# Patient Record
Sex: Female | Born: 1980 | Race: White | Hispanic: No | Marital: Married | Smoking: Former smoker
Health system: Southern US, Community
[De-identification: ages and names within clinical notes are randomized; demographics above are authoritative.]

## PROBLEM LIST (undated history)

## (undated) DIAGNOSIS — K828 Other specified diseases of gallbladder: Secondary | ICD-10-CM

## (undated) DIAGNOSIS — L503 Dermatographic urticaria: Secondary | ICD-10-CM

## (undated) HISTORY — PX: BUNIONECTOMY: SHX129

---

## 2013-11-30 ENCOUNTER — Emergency Department (INDEPENDENT_AMBULATORY_CARE_PROVIDER_SITE_OTHER): Payer: BC Managed Care – PPO

## 2013-11-30 ENCOUNTER — Emergency Department (INDEPENDENT_AMBULATORY_CARE_PROVIDER_SITE_OTHER)
Admission: EM | Admit: 2013-11-30 | Discharge: 2013-11-30 | Disposition: A | Discharge status: Home / Self Care | Payer: BC Managed Care – PPO | Source: Home / Self Care | Attending: Family Medicine | Admitting: Family Medicine

## 2013-11-30 ENCOUNTER — Encounter: Payer: Self-pay | Admitting: Emergency Medicine

## 2013-11-30 DIAGNOSIS — R059 Cough, unspecified: Secondary | ICD-10-CM

## 2013-11-30 DIAGNOSIS — R079 Chest pain, unspecified: Secondary | ICD-10-CM

## 2013-11-30 DIAGNOSIS — R05 Cough: Secondary | ICD-10-CM

## 2013-11-30 DIAGNOSIS — R0781 Pleurodynia: Secondary | ICD-10-CM

## 2013-11-30 HISTORY — DX: Other specified diseases of gallbladder: K82.8

## 2013-11-30 HISTORY — DX: Dermatographic urticaria: L50.3

## 2013-11-30 NOTE — ED Provider Notes (Signed)
CSN: [id number]     Arrival date & time 11/30/13  1750 History   First MD Initiated Contact with Patient 11/30/13 1831     Chief Complaint  Patient presents with  . Chest Pain      HPI Comments: 6 days ago patient was reaching overhead to a high shelf when she felt a sudden sharp pain in her left chest.  The pain has persisted, and is worse with movement.  She denies shortness of breath.  She recalls no recent trauma, although she was lifting heavy objects about a week ago. About one month ago she had a cold that resolved  Patient is a [age] y.o. female presenting with chest pain. The history is provided by the patient.  Chest Pain Pain location:  L chest Pain quality: stabbing   Pain quality: not radiating   Pain radiates to:  Does not radiate Pain radiates to the back: no   Pain severity:  Moderate Onset quality:  Sudden Duration:  6 days Timing:  Intermittent Progression:  Unchanged Chronicity:  New Context: raising an arm   Relieved by:  Nothing Worsened by:  Coughing, deep breathing and movement Ineffective treatments:  None tried Associated symptoms: no abdominal pain, no back pain, no cough, no dizziness, no nausea, no palpitations and no shortness of breath     Past Medical History  Diagnosis Date  . Biliary dyskinesia   . Dermatographia    Past Surgical History  Procedure Laterality Date  . Bunionectomy     Family History  Problem Relation Age of Onset  . Heart disease Father    History  Substance Use Topics  . Smoking status: Former Games developermoker  . Smokeless tobacco: Not on file  . Alcohol Use: Yes   OB History   Grav Para Term Preterm Abortions TAB SAB Ect Mult Living                 Review of Systems  Respiratory: Negative for cough and shortness of breath.   Cardiovascular: Positive for chest pain. Negative for palpitations.  Gastrointestinal: Negative for nausea and abdominal pain.  Musculoskeletal: Negative for back pain.  Neurological: Negative for  dizziness.    Allergies  Review of patient's allergies indicates no known allergies.  Home Medications   Current Outpatient Rx  Name  Route  Sig  Dispense  Refill  . acyclovir (ZOVIRAX) 800 MG tablet   Oral   Take 800 mg by mouth 5 (five) times daily.         Marland Kitchen. albuterol (PROVENTIL HFA;VENTOLIN HFA) 108 (90 BASE) MCG/ACT inhaler   Inhalation   Inhale into the lungs every 6 (six) hours as needed for wheezing or shortness of breath.         . drospirenone-ethinyl estradiol (YASMIN,ZARAH,SYEDA) 3-0.03 MG tablet   Oral   Take 1 tablet by mouth daily.         . hydrOXYzine (ATARAX/VISTARIL) 10 MG tablet   Oral   Take 10 mg by mouth 3 (three) times daily as needed.         . ondansetron (ZOFRAN) 4 MG tablet   Oral   Take 4 mg by mouth every 8 (eight) hours as needed for nausea or vomiting.          BP 136/84  Pulse 94  Temp(Src) 98.6 F (37 C) (Oral)  Resp 16  Ht 5' 4.5" (1.638 m)  Wt 128 lb (58.06 kg)  BMI 21.64 kg/m2  SpO2 98%  LMP  11/15/2013 Physical Exam  Nursing note and vitals reviewed. Constitutional: She is oriented to person, place, and time. She appears well-developed and well-nourished. No distress.  HENT:  Head: Atraumatic.  Eyes: Conjunctivae are normal. Pupils are equal, round, and reactive to light.  Neck: Normal range of motion.  Cardiovascular: Normal heart sounds.   Pulmonary/Chest: Breath sounds normal. No respiratory distress. She has no wheezes. She has no rales. She exhibits tenderness. She exhibits no crepitus and no swelling.    Left anterior/inferior chest has tenderness to palpation as noted on diagram.  Abdominal: There is no tenderness.  Neurological: She is alert and oriented to person, place, and time.  Skin: Skin is warm and dry. No rash noted.    ED Course  Procedures  none   Imaging Review Dg Chest 2 View  11/30/2013   CLINICAL DATA:  Cough and chest pain  EXAM: CHEST  2 VIEW  COMPARISON:  None.  FINDINGS: The lungs  are clear. Heart size and pulmonary vascularity are normal. No adenopathy. No pneumothorax. No bone lesions.  IMPRESSION: No abnormality noted.   Electronically Signed   By: Bretta Bang M.D.   On: 11/30/2013 19:22   Dg Ribs Unilateral Left  11/30/2013   CLINICAL DATA:  Cough, left anterior rib pain  EXAM: LEFT RIBS - 2 VIEW  COMPARISON:  Radiograph the chest performed on the same day.  FINDINGS: Metallic BB overlies the area of pain in the lower anterior left chest. No acute rib fracture identified. No other osseous abnormality. The partially visualized heart and lungs are clear. No pneumothorax.  IMPRESSION: Negative.   Electronically Signed   By: Rise Mu M.D.   On: 11/30/2013 19:32      MDM   1. Rib pain on left side; suspect intercostal muscle strain     Consider wearing a well-fitting sportsbra daytime until pain improves.  Apply ice pack 3 to 4 times daily for 3 to 4 days.  May take Ibuprofen 200mg , 3 to 4 tabs every 8 hours with food Followup with urgent care if symptoms worsen.    Lattie Haw, MD 12/05/13 [phone]

## 2013-11-30 NOTE — ED Notes (Signed)
Pt c/o LT rib area pain x 6 days. Denies injury. She does report a recent URI.

## 2013-11-30 NOTE — Discharge Instructions (Signed)
Consider wearing a well-fitting sportsbra daytime until pain improves.  Apply ice pack 3 to 4 times daily for 3 to 4 days.  May take Ibuprofen 200mg , 3 to 4 tabs every 8 hours with food.    Chest Wall Pain Chest wall pain is pain in or around the bones and muscles of your chest. It may take up to 6 weeks to get better. It may take longer if you must stay physically active in your work and activities.  CAUSES  Chest wall pain may happen on its own. However, it may be caused by:  A viral illness like the flu.  Injury.  Coughing.  Exercise.  Arthritis.  Fibromyalgia.  Shingles. HOME CARE INSTRUCTIONS   Avoid overtiring physical activity. Try not to strain or perform activities that cause pain. This includes any activities using your chest or your abdominal and side muscles, especially if heavy weights are used.  Put ice on the sore area.  Put ice in a plastic bag.  Place a towel between your skin and the bag.  Leave the ice on for 15-20 minutes per hour while awake for the first 2 days.  Only take over-the-counter or prescription medicines for pain, discomfort, or fever as directed by your caregiver. SEEK IMMEDIATE MEDICAL CARE IF:   Your pain increases, or you are very uncomfortable.  You have a fever.  Your chest pain becomes worse.  You have new, unexplained symptoms.  You have nausea or vomiting.  You feel sweaty or lightheaded.  You have a cough with phlegm (sputum), or you cough up blood. MAKE SURE YOU:   Understand these instructions.  Will watch your condition.  Will get help right away if you are not doing well or get worse. Document Released: 11/12/2005 Document Revised: 02/04/2012 Document Reviewed: 07/09/2011 [facility] Patient Information 2014 [place]Care, [place].

## 2013-12-07 ENCOUNTER — Telehealth: Payer: Self-pay | Admitting: Emergency Medicine

## 2013-12-07 NOTE — ED Notes (Signed)
Inquired about patient's status; encourage them to call with questions/concerns.  

## 2015-09-02 IMAGING — CR DG RIBS 2V*L*
2 series · 2 of 2 positions shown · non-contrast
Comparison: Radiograph the chest performed on the same day.

CLINICAL DATA: Cough, left anterior rib pain

EXAM:
LEFT RIBS - 2 VIEW

[view not recorded (1 of 2)]
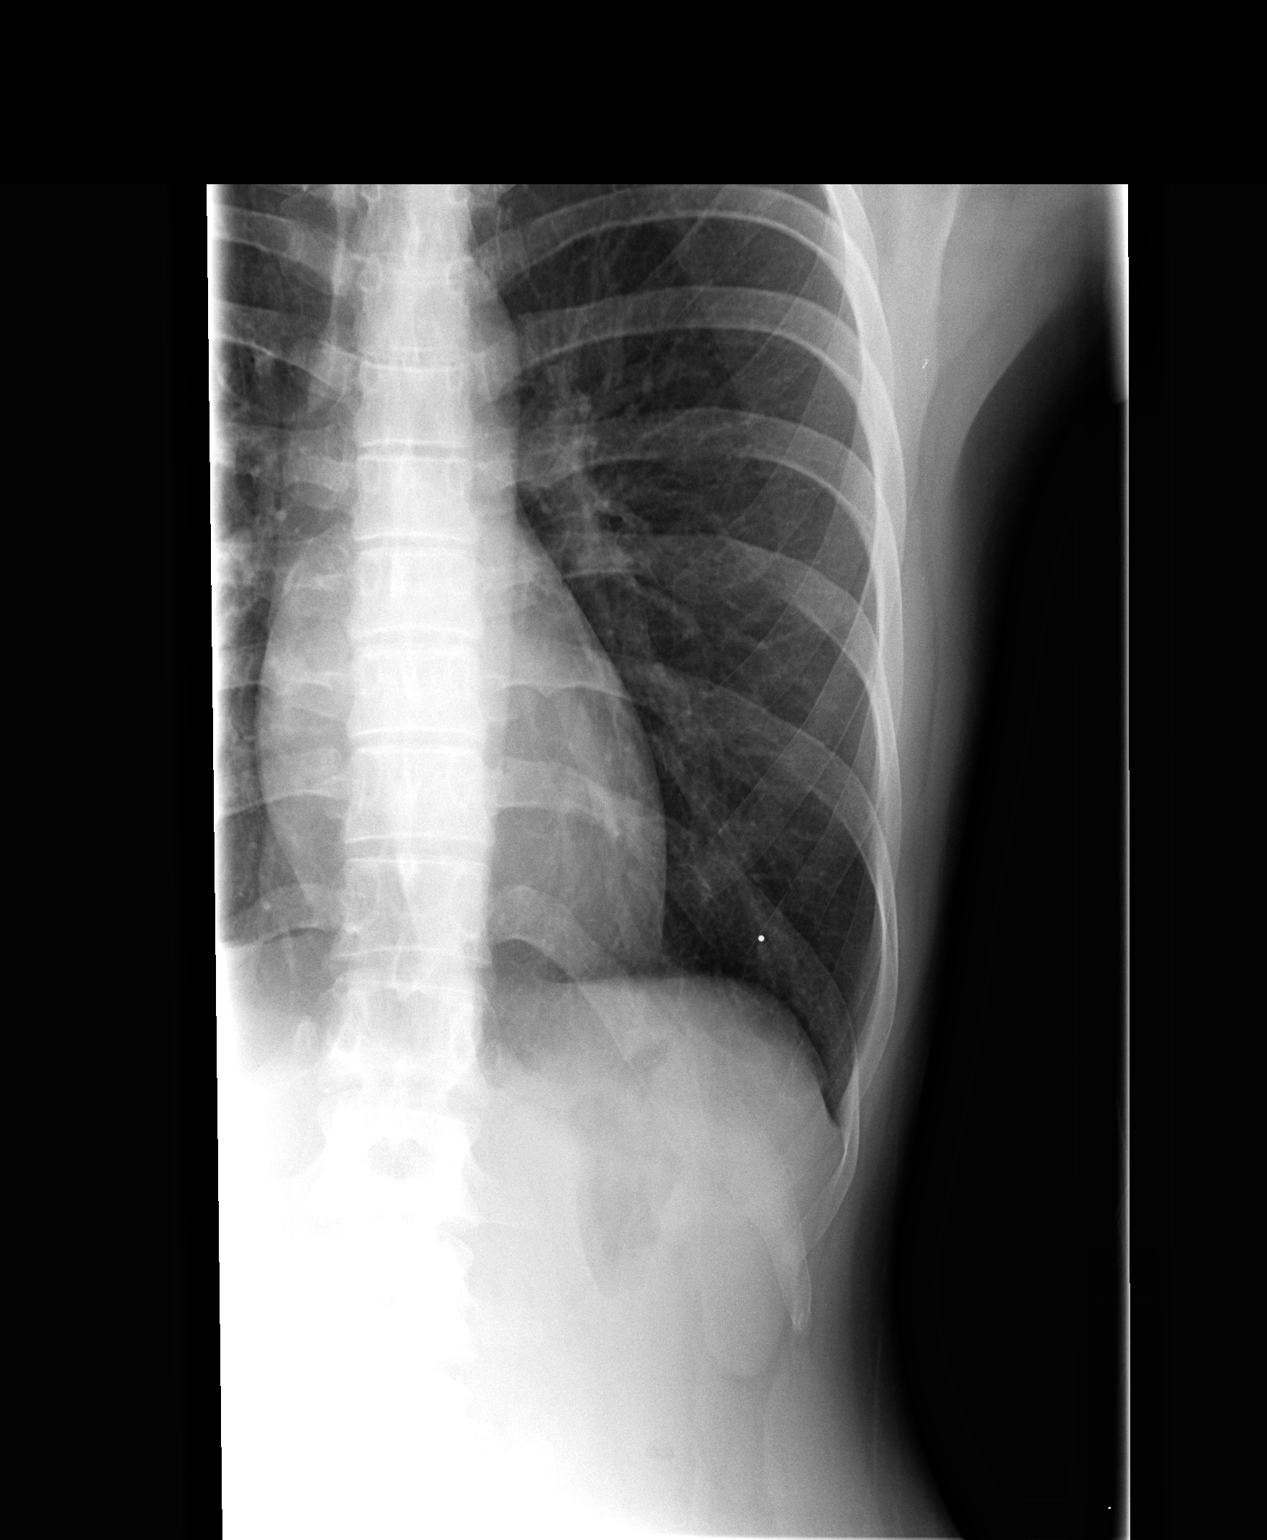

[view not recorded (2 of 2)]
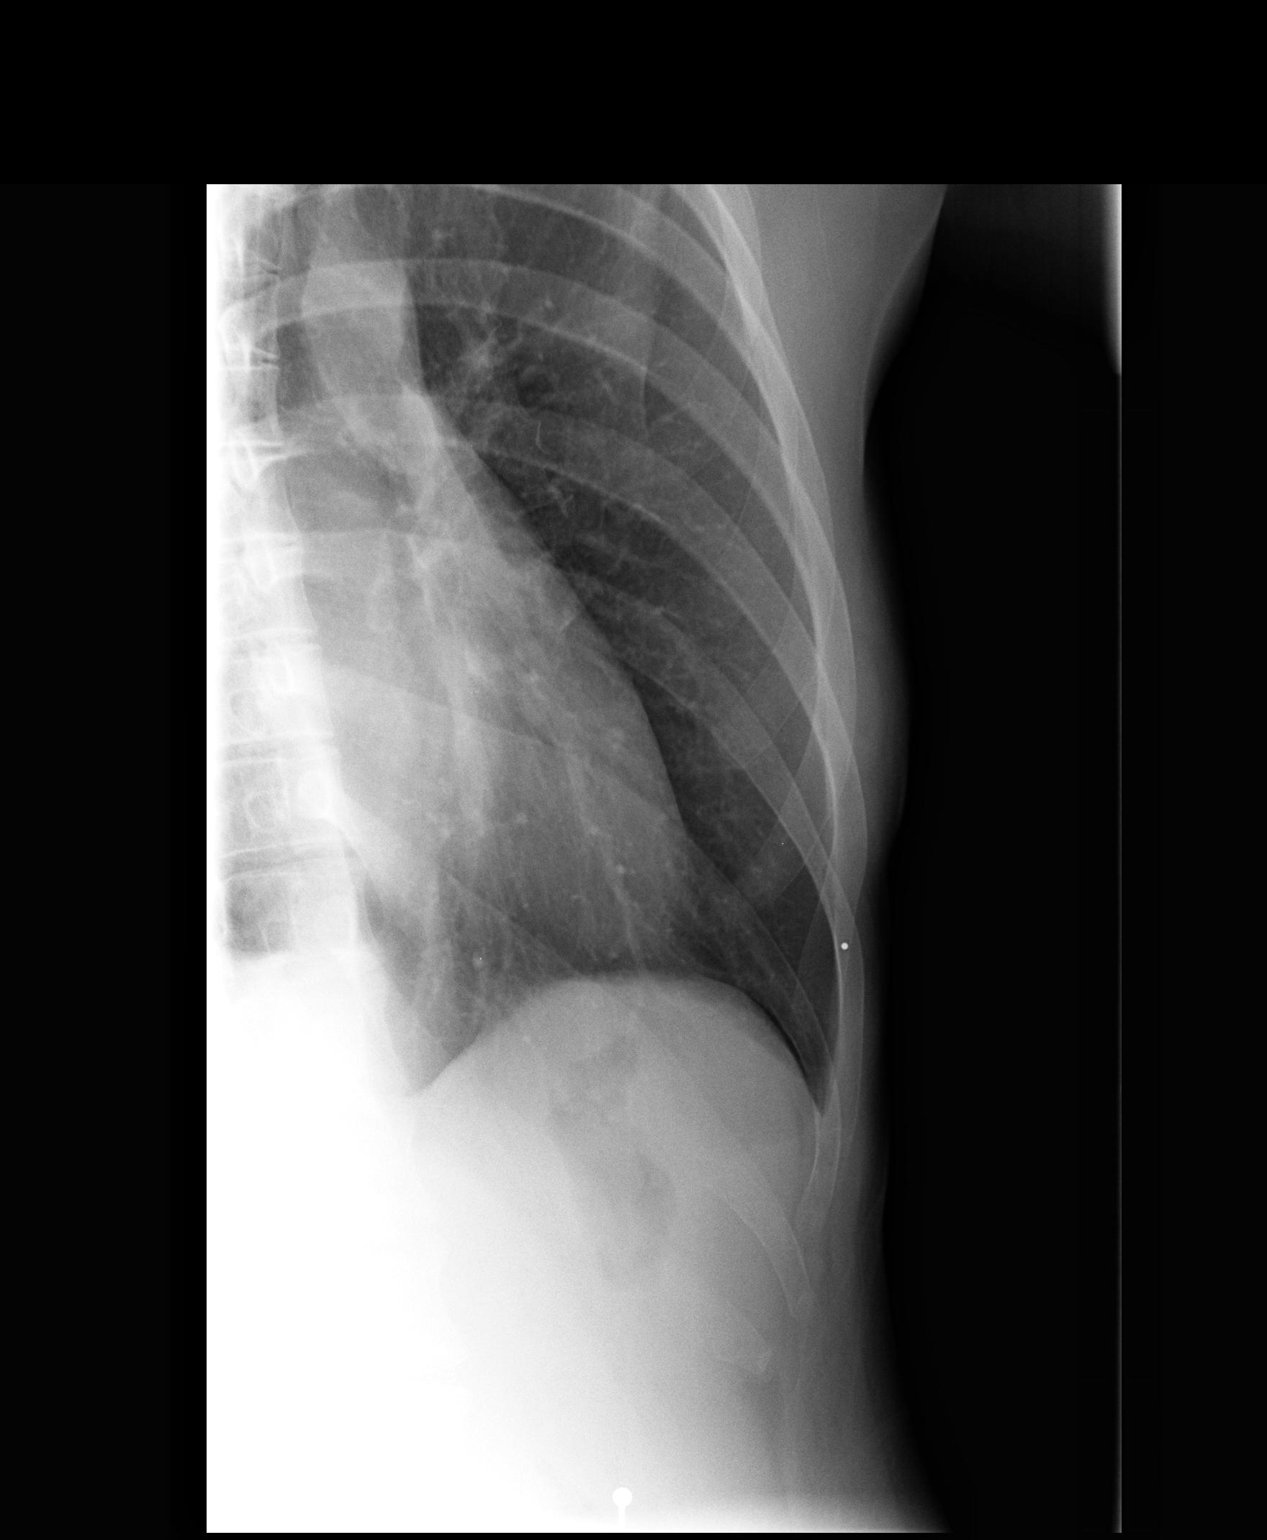

[2 of 2 positions shown; findings below may reference images not displayed]

FINDINGS: Metallic BB overlies the area of pain in the lower anterior left
chest. No acute rib fracture identified. No other osseous
abnormality. The partially visualized heart and lungs are clear. No
pneumothorax.
IMPRESSION: Negative.

## 2015-09-02 IMAGING — CR DG CHEST 2V
2 series · 2 of 2 positions shown · non-contrast
Comparison: None.

CLINICAL DATA: Cough and chest pain

EXAM:
CHEST  2 VIEW

[view not recorded (1 of 2)]
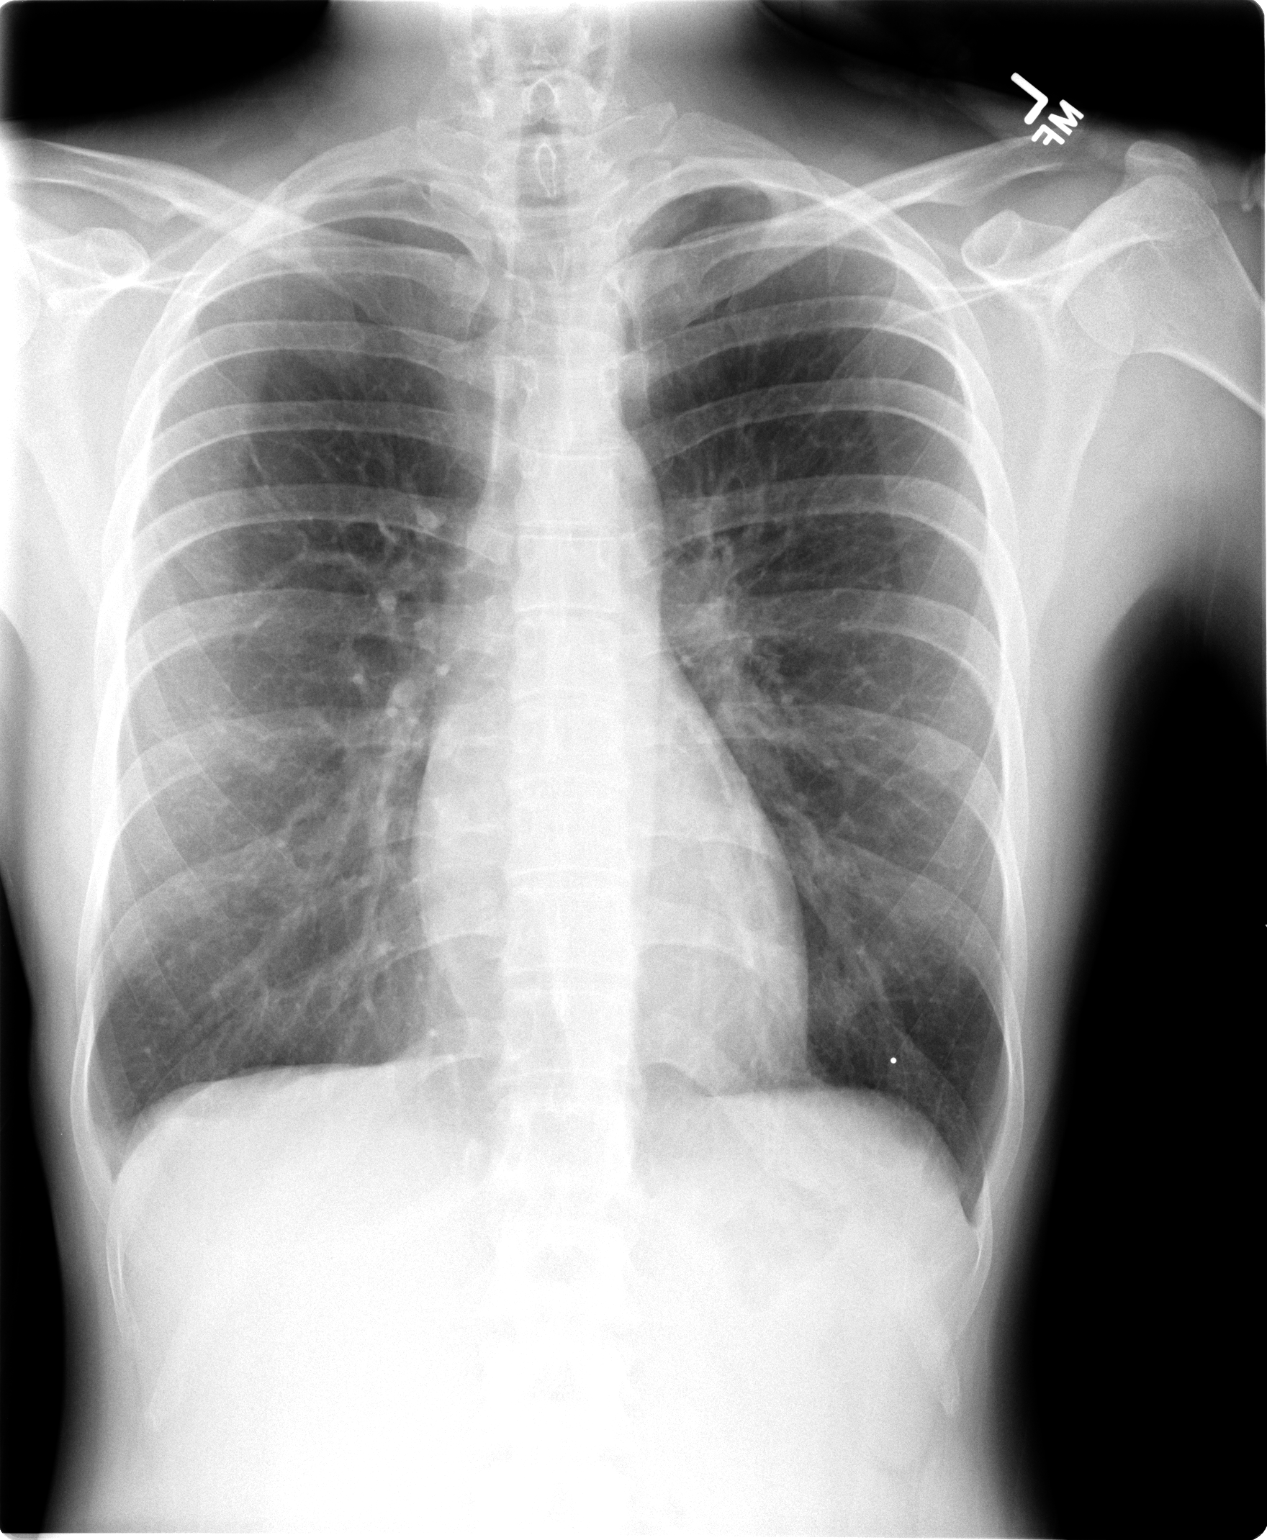

[view not recorded (2 of 2)]
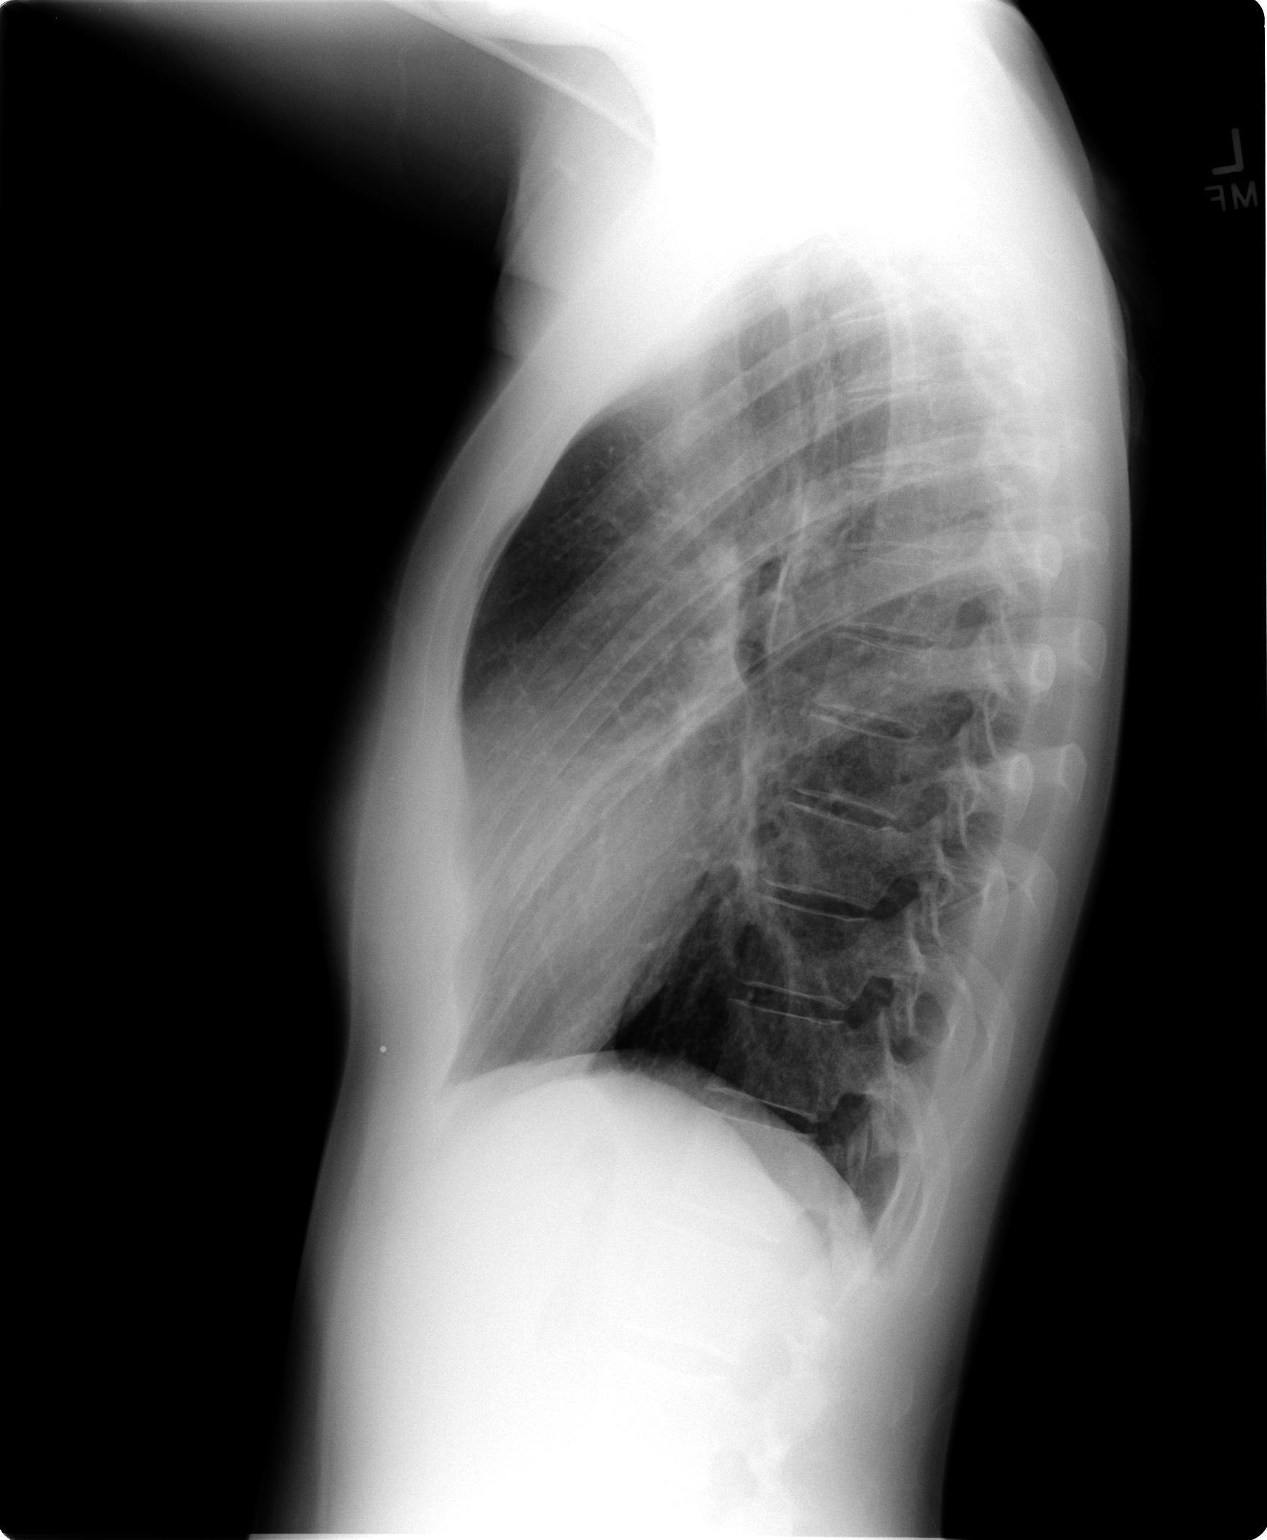

[2 of 2 positions shown; findings below may reference images not displayed]

FINDINGS: The lungs are clear. Heart size and pulmonary vascularity are
normal. No adenopathy. No pneumothorax. No bone lesions.
IMPRESSION: No abnormality noted.
